# Patient Record
Sex: Female | Born: 2000 | Race: White | Hispanic: No | Marital: Single | Smoking: Never smoker
Health system: Southern US, Community
[De-identification: ages and names within clinical notes are randomized; demographics above are authoritative.]

## PROBLEM LIST (undated history)

## (undated) DIAGNOSIS — T7840XA Allergy, unspecified, initial encounter: Secondary | ICD-10-CM

## (undated) DIAGNOSIS — F840 Autistic disorder: Secondary | ICD-10-CM

## (undated) HISTORY — DX: Allergy, unspecified, initial encounter: T78.40XA

---
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
## 2018-03-24 ENCOUNTER — Ambulatory Visit: Payer: Medicaid Other

## 2018-03-24 ENCOUNTER — Ambulatory Visit (INDEPENDENT_AMBULATORY_CARE_PROVIDER_SITE_OTHER): Payer: Medicaid Other | Admitting: *Deleted

## 2018-03-24 DIAGNOSIS — N920 Excessive and frequent menstruation with regular cycle: Secondary | ICD-10-CM | POA: Diagnosis not present

## 2018-03-24 DIAGNOSIS — Z3042 Encounter for surveillance of injectable contraceptive: Secondary | ICD-10-CM

## 2018-03-31 ENCOUNTER — Ambulatory Visit: Payer: Medicaid Other

## 2018-06-10 ENCOUNTER — Other Ambulatory Visit: Payer: Self-pay | Admitting: Family Medicine

## 2018-06-10 DIAGNOSIS — N920 Excessive and frequent menstruation with regular cycle: Secondary | ICD-10-CM

## 2018-06-11 ENCOUNTER — Ambulatory Visit (INDEPENDENT_AMBULATORY_CARE_PROVIDER_SITE_OTHER): Payer: Medicaid Other | Admitting: *Deleted

## 2018-06-11 DIAGNOSIS — Z3042 Encounter for surveillance of injectable contraceptive: Secondary | ICD-10-CM | POA: Diagnosis not present

## 2018-06-11 MED ORDER — MEDROXYPROGESTERONE ACETATE 150 MG/ML IM SUSP
150.0000 mg | Freq: Once | INTRAMUSCULAR | Status: AC
  Administered 2018-06-11: 150 mg via INTRAMUSCULAR

## 2018-08-22 ENCOUNTER — Telehealth: Payer: Self-pay | Admitting: Family

## 2018-08-22 NOTE — Telephone Encounter (Signed)
Patient aware she may receive next dep inj  2/26-3/12

## 2018-09-05 ENCOUNTER — Ambulatory Visit (INDEPENDENT_AMBULATORY_CARE_PROVIDER_SITE_OTHER): Payer: Medicaid Other | Admitting: *Deleted

## 2018-09-05 DIAGNOSIS — Z3042 Encounter for surveillance of injectable contraceptive: Secondary | ICD-10-CM

## 2018-09-05 MED ORDER — MEDROXYPROGESTERONE ACETATE 150 MG/ML IM SUSY
150.0000 mg | PREFILLED_SYRINGE | INTRAMUSCULAR | Status: AC
Start: 2018-09-05 — End: 2018-09-05
  Administered 2018-09-05: 150 mg via INTRAMUSCULAR

## 2018-09-05 NOTE — Progress Notes (Signed)
Injection given and tolerated well.

## 2018-12-17 ENCOUNTER — Telehealth: Payer: Self-pay | Admitting: Family Medicine

## 2018-12-17 DIAGNOSIS — N920 Excessive and frequent menstruation with regular cycle: Secondary | ICD-10-CM

## 2018-12-17 MED ORDER — MEDROXYPROGESTERONE ACETATE 150 MG/ML IM SUSY: 1.0000 mL | PREFILLED_SYRINGE | INTRAMUSCULAR | 2 refills | Status: DC

## 2018-12-17 NOTE — Telephone Encounter (Signed)
Angela Mcdonald  Please send in Depo

## 2018-12-19 ENCOUNTER — Other Ambulatory Visit: Payer: Self-pay

## 2018-12-19 ENCOUNTER — Ambulatory Visit (INDEPENDENT_AMBULATORY_CARE_PROVIDER_SITE_OTHER): Payer: Medicaid Other

## 2018-12-19 DIAGNOSIS — Z3042 Encounter for surveillance of injectable contraceptive: Secondary | ICD-10-CM | POA: Diagnosis not present

## 2018-12-19 MED ORDER — MEDROXYPROGESTERONE ACETATE 150 MG/ML IM SUSP
150.0000 mg | Freq: Once | INTRAMUSCULAR | Status: AC
  Administered 2018-12-19: 150 mg via INTRAMUSCULAR

## 2019-01-21 IMAGING — DX DG CHEST 2V
2 series · 2 of 2 positions shown · non-contrast
Comparison: None.

CLINICAL DATA: Cough, fever, shortness of breath.

EXAM:
CHEST  2 VIEW

[chest pa]
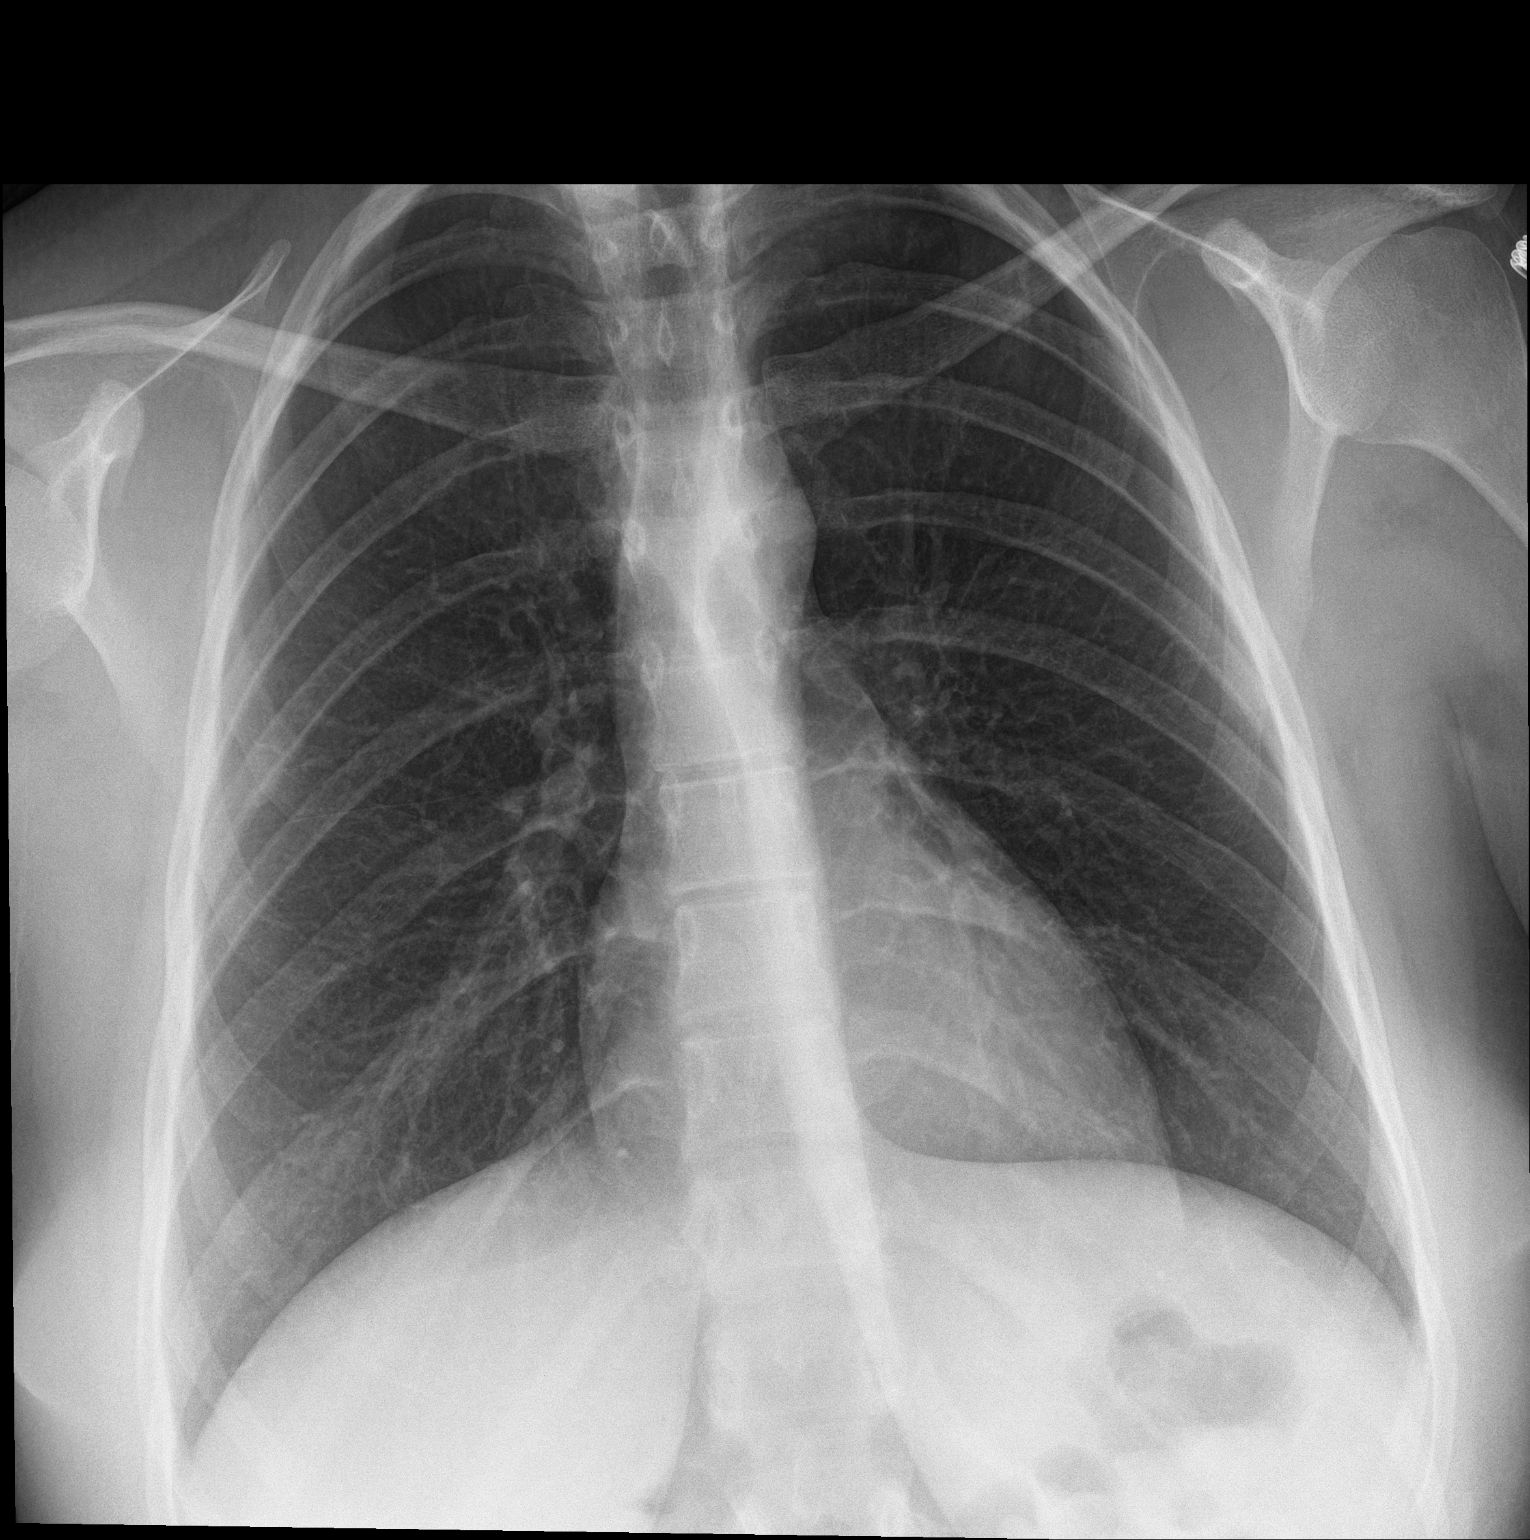

[chest lat]
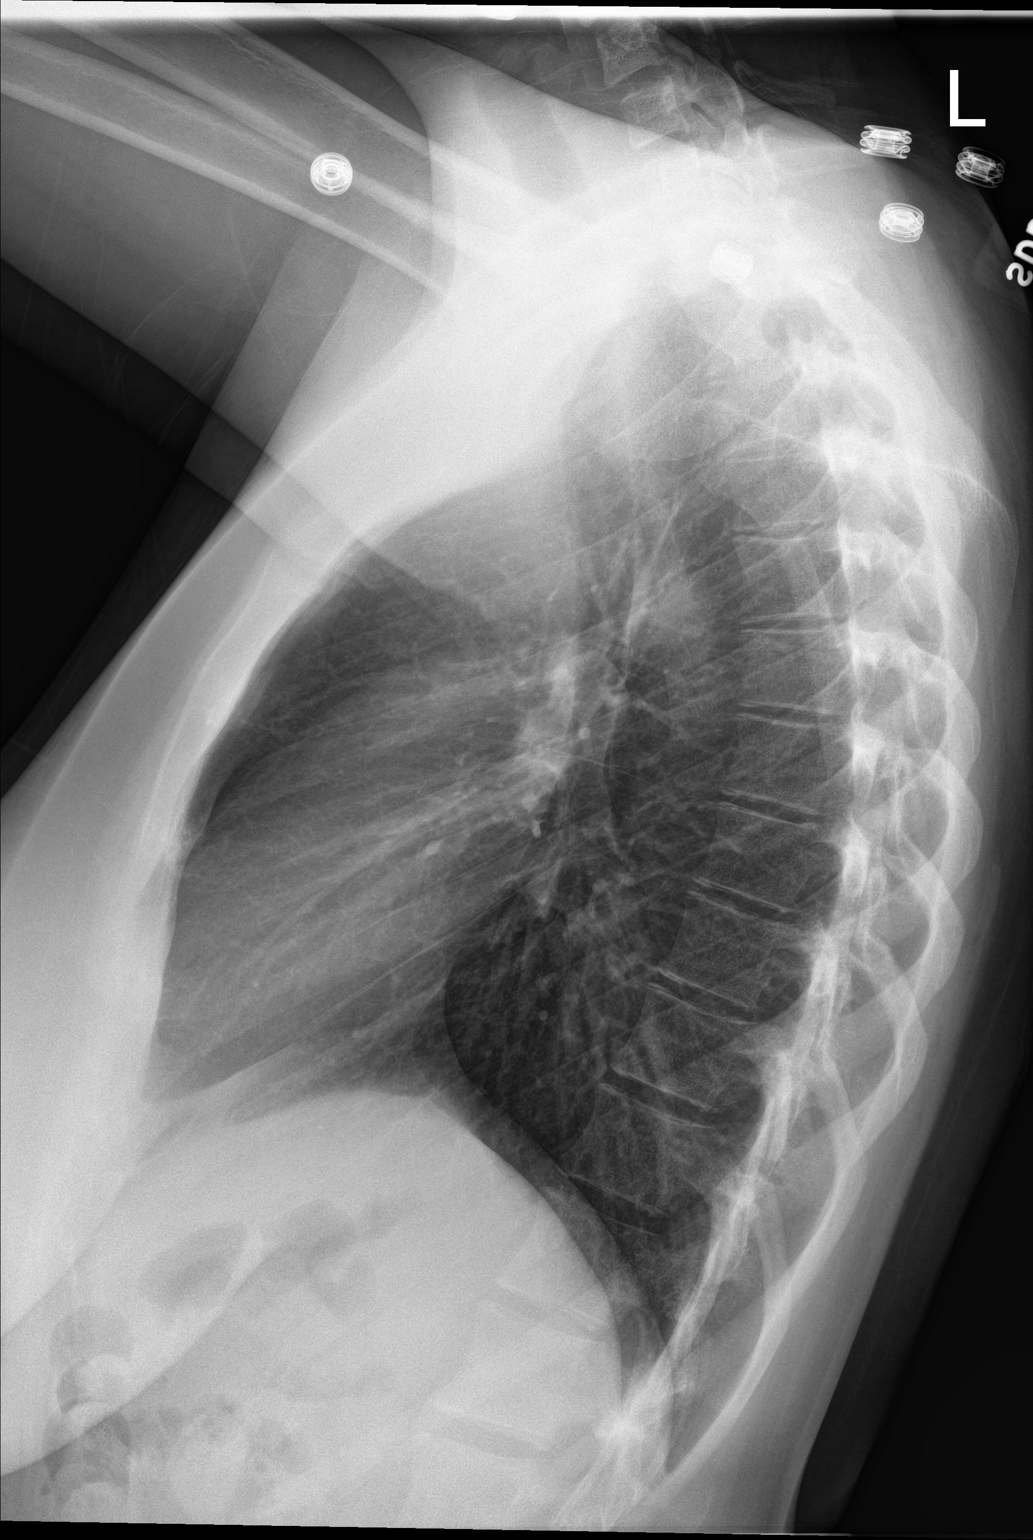

[2 of 2 positions shown; findings below may reference images not displayed]

FINDINGS: Normal cardiac and mediastinal contours. No consolidative pulmonary
opacities. No pleural effusion or pneumothorax.
IMPRESSION: No active cardiopulmonary disease.

## 2019-03-18 ENCOUNTER — Ambulatory Visit (INDEPENDENT_AMBULATORY_CARE_PROVIDER_SITE_OTHER): Payer: Medicaid Other | Admitting: Family Medicine

## 2019-03-18 ENCOUNTER — Encounter: Payer: Self-pay | Admitting: Family Medicine

## 2019-03-18 DIAGNOSIS — J309 Allergic rhinitis, unspecified: Secondary | ICD-10-CM | POA: Diagnosis not present

## 2019-03-18 DIAGNOSIS — F430 Acute stress reaction: Secondary | ICD-10-CM | POA: Diagnosis not present

## 2019-03-18 MED ORDER — LORATADINE 10 MG PO TABS: 10.0000 mg | ORAL_TABLET | Freq: Every day | ORAL | 11 refills | Status: DC

## 2019-03-18 NOTE — Progress Notes (Signed)
Virtual Visit via telephone Note  I connected with Angela Mcdonald on 03/18/19 at 0901 by telephone and verified that I am speaking with the correct person using two identifiers. Angela Mcdonald is currently located at home and Sister are currently with her during visit. The provider, Elige RadonJoshua A Dettinger, MD is located in their office at time of visit.  Call ended at 0914  I discussed the limitations, risks, security and privacy concerns of performing an evaluation and management service by telephone and the availability of in person appointments. I also discussed with the patient that there may be a patient responsible charge related to this service. The patient expressed understanding and agreed to proceed.   History and Present Illness: Patient sister is providing history because patient is severely autistic and essentially nonverbal.  The patient is complaining of headache by grabbing her head and has some sneezing and slight dry cough. They have noticed this over 1 month with the headaches. She has taking benadryl and it has not been changing. It has been off and on inconsistently. Deny any fevers or chills.   There is some concern about how she is handling the loss of her father.  She is stemming more and more distracted.  No diagnosis found.  Outpatient Encounter Medications as of 03/18/2019  Medication Sig  . cephALEXin (KEFLEX) 500 MG capsule Take 1 capsule (500 mg total) by mouth 2 (two) times daily.  . diphenhydrAMINE (BENADRYL) 25 MG tablet Take 25 mg by mouth every 6 (six) hours as needed for allergies.  . Lactulose 20 GM/30ML SOLN Take 30 mLs (20 g total) by mouth 2 (two) times daily.  . medroxyPROGESTERone Acetate 150 MG/ML SUSY Inject 1 mL (150 mg total) into the muscle every 3 (three) months.  . ondansetron (ZOFRAN ODT) 4 MG disintegrating tablet Take 1 tablet (4 mg total) by mouth 3 (three) times daily as needed for nausea or vomiting.  . polyethylene glycol powder  (GLYCOLAX/MIRALAX) powder Take 17 g by mouth 2 (two) times daily as needed.   No facility-administered encounter medications on file as of 03/18/2019.     Review of Systems  Constitutional: Negative for chills and fever.  HENT: Positive for sneezing. Negative for congestion, ear discharge, ear pain, sinus pressure and sore throat.   Eyes: Negative for redness and visual disturbance.  Respiratory: Negative for cough, chest tightness, shortness of breath and wheezing.   Cardiovascular: Negative for chest pain and leg swelling.  Musculoskeletal: Negative for back pain and gait problem.  Skin: Negative for rash.  Neurological: Positive for headaches. Negative for dizziness and light-headedness.  Psychiatric/Behavioral: Positive for behavioral problems, decreased concentration and dysphoric mood. Negative for agitation, self-injury, sleep disturbance and suicidal ideas. The patient is not nervous/anxious.   All other systems reviewed and are negative.   Observations/Objective: Patient sounds comfortable and in no acute   Assessment and Plan: Problem List Items Addressed This Visit    None    Visit Diagnoses    Allergic sinusitis    -  Primary   Relevant Medications   loratadine (CLARITIN) 10 MG tablet   Acute stress reaction       Relevant Orders   Ambulatory referral to Psychology       Follow Up Instructions: Follow up in needed    I discussed the assessment and treatment plan with the patient. The patient was provided an opportunity to ask questions and all were answered. The patient agreed with the plan and demonstrated an understanding  of the instructions.   The patient was advised to call back or seek an in-person evaluation if the symptoms worsen or if the condition fails to improve as anticipated.  The above assessment and management plan was discussed with the patient. The patient verbalized understanding of and has agreed to the management plan. Patient is aware to call  the clinic if symptoms persist or worsen. Patient is aware when to return to the clinic for a follow-up visit. Patient educated on when it is appropriate to go to the emergency department.    I provided 13 minutes of non-face-to-face time during this encounter.    Worthy Rancher, MD

## 2019-03-19 NOTE — Progress Notes (Signed)
She is an adult so she will not be able to go to the Psychological and [place] it be ok to send her to behavioral health? I know that she has a disability but if it is an outside her gaurdian will have to call due to payment.

## 2020-08-19 ENCOUNTER — Encounter: Payer: Self-pay | Admitting: Family Medicine

## 2020-08-19 ENCOUNTER — Other Ambulatory Visit: Payer: Self-pay

## 2020-08-19 ENCOUNTER — Ambulatory Visit (INDEPENDENT_AMBULATORY_CARE_PROVIDER_SITE_OTHER): Payer: Medicaid Other | Admitting: Family Medicine

## 2020-08-19 VITALS — BP 119/79 | HR 101 | Ht 60.0 in | Wt 150.0 lb

## 2020-08-19 DIAGNOSIS — J302 Other seasonal allergic rhinitis: Secondary | ICD-10-CM | POA: Diagnosis not present

## 2020-08-19 DIAGNOSIS — Z0000 Encounter for general adult medical examination without abnormal findings: Secondary | ICD-10-CM

## 2020-08-19 DIAGNOSIS — Z0001 Encounter for general adult medical examination with abnormal findings: Secondary | ICD-10-CM | POA: Diagnosis not present

## 2020-08-19 DIAGNOSIS — F840 Autistic disorder: Secondary | ICD-10-CM | POA: Diagnosis not present

## 2020-08-19 MED ORDER — FEXOFENADINE HCL 60 MG PO TABS: 60.0000 mg | ORAL_TABLET | Freq: Two times a day (BID) | ORAL | 3 refills | Status: AC | PRN

## 2020-08-19 MED ORDER — FLUTICASONE PROPIONATE 50 MCG/ACT NA SUSP: 1.0000 | Freq: Two times a day (BID) | NASAL | 6 refills | Status: DC | PRN

## 2020-08-19 NOTE — Progress Notes (Signed)
BP 119/79   Pulse (!) 101   Ht 5' (1.524 m)   Wt 150 lb (68 kg)   LMP 08/08/2020 (Approximate)   SpO2 96%   BMI 29.29 kg/m    Subjective:   Patient ID: Angela Mcdonald, female    DOB: [birth year], 20 y.o.   MRN: [id number]  HPI: Angela Mcdonald is a 20 y.o. female presenting on 08/19/2020 for Medical Management of Chronic Issues and Allergies (Would like to discuss new medication regimen. One that has been prescribed in the past caused nausea)   HPI Adult well exam and physical Patient is coming in today for adult well exam and physical Real complaint that she has is that she does have seasonal allergies.  She does struggle with her menstrual cycles, they are normal but just because of her autism has been a challenge for her.  The sensory is especially an issue.  She was doing Depo shots before and they did help with the cycles but it became too difficult for the family to make it up consistently.  We did discuss doing a Nexplanon and they are agreeable for this.  Because of her autism and sensory they would like to hold off on doing a full Pap smear as she has never been sexually active but will discuss in the future at some point.  Relevant past medical, surgical, family and social history reviewed and updated as indicated. Interim medical history since our last visit reviewed. Allergies and medications reviewed and updated.  Review of Systems  Constitutional: Negative for chills and fever.  HENT: Negative for ear pain and tinnitus.   Eyes: Negative for pain.  Respiratory: Negative for cough, shortness of breath and wheezing.   Cardiovascular: Negative for chest pain, palpitations and leg swelling.  Gastrointestinal: Negative for abdominal pain, blood in stool, constipation and diarrhea.  Genitourinary: Negative for dysuria and hematuria.  Musculoskeletal: Negative for back pain and myalgias.  Skin: Negative for rash.  Neurological: Negative for dizziness, weakness and  headaches.  Psychiatric/Behavioral: Negative for suicidal ideas.    Per HPI unless specifically indicated above   Allergies as of 08/19/2020      Reactions   Cat Hair Extract Anaphylaxis      Medication List       Accurate as of August 19, 2020  2:55 PM. If you have any questions, ask your nurse or doctor.        STOP taking these medications   medroxyPROGESTERone Acetate 150 MG/ML Susy Stopped by: Fransisca Kaufmann Dettinger, MD     TAKE these medications   diphenhydrAMINE 25 MG tablet Commonly known as: BENADRYL Take 25 mg by mouth every 6 (six) hours as needed for allergies.   HM MULTIVITAMIN ADULT GUMMY PO Take by mouth daily.   Lactulose 20 GM/30ML Soln Take 30 mLs (20 g total) by mouth 2 (two) times daily.   loratadine 10 MG tablet Commonly known as: CLARITIN Take 1 tablet (10 mg total) by mouth daily.   ondansetron 4 MG disintegrating tablet Commonly known as: Zofran ODT Take 1 tablet (4 mg total) by mouth 3 (three) times daily as needed for nausea or vomiting.   polyethylene glycol powder 17 GM/SCOOP powder Commonly known as: GLYCOLAX/MIRALAX Take 17 g by mouth 2 (two) times daily as needed.        Objective:   BP 119/79   Pulse (!) 101   Ht 5' (1.524 m)   Wt 150 lb (68 kg)   LMP 08/08/2020 (Approximate)  SpO2 96%   BMI 29.29 kg/m   Wt Readings from Last 3 Encounters:  08/19/20 150 lb (68 kg)  12/27/17 140 lb 12.8 oz (63.9 kg) (73 %, Z= 0.61)*  09/11/17 143 lb 3.2 oz (65 kg) (77 %, Z= 0.72)*   * Growth percentiles are based on CDC (Girls, 2-20 Years) data.    Physical Exam Vitals and nursing note reviewed.  Constitutional:      General: She is not in acute distress.    Appearance: She is well-developed and well-nourished. She is not diaphoretic.  Eyes:     Extraocular Movements: EOM normal.     Conjunctiva/sclera: Conjunctivae normal.  Cardiovascular:     Rate and Rhythm: Normal rate and regular rhythm.     Pulses: Intact distal pulses.      Heart sounds: Normal heart sounds. No murmur heard.   Pulmonary:     Effort: Pulmonary effort is normal. No respiratory distress.     Breath sounds: Normal breath sounds. No wheezing.  Musculoskeletal:        General: No tenderness or edema. Normal range of motion.  Skin:    General: Skin is warm and dry.     Findings: No rash.  Neurological:     Mental Status: She is alert and oriented to person, place, and time.     Coordination: Coordination normal.  Psychiatric:        Mood and Affect: Mood and affect normal.        Behavior: Behavior normal.       Assessment & Plan:   Problem List Items Addressed This Visit      Other   Autism    Other Visit Diagnoses    Well adult exam    -  Primary   Relevant Orders   CBC with Differential/Platelet   CMP14+EGFR   Lipid panel   TSH   Seasonal allergies       Relevant Medications   fexofenadine (ALLEGRA ALLERGY) 60 MG tablet   fluticasone (FLONASE) 50 MCG/ACT nasal spray      Change her allergy medicine to Allegra and Flonase that she can use as needed. Follow up plan: Return in about 1 year (around 08/19/2021), or if symptoms worsen or fail to improve.  Counseling provided for all of the vaccine components No orders of the defined types were placed in this encounter.   Caryl Pina, MD [facility] 08/19/2020, 2:55 PM

## 2020-08-20 LAB — CMP14+EGFR
ALT: 16 IU/L (ref 0–32)
AST: 21 IU/L (ref 0–40)
Albumin/Globulin Ratio: 1.5 (ref 1.2–2.2)
Albumin: 4.7 g/dL (ref 3.9–5.0)
Alkaline Phosphatase: 97 IU/L (ref 44–121)
BUN/Creatinine Ratio: 18 (ref 9–23)
BUN: 12 mg/dL (ref 6–20)
Bilirubin Total: 0.4 mg/dL (ref 0.0–1.2)
CO2: 19 mmol/L — ABNORMAL LOW (ref 20–29)
Calcium: 9.9 mg/dL (ref 8.7–10.2)
Chloride: 100 mmol/L (ref 96–106)
Creatinine, Ser: 0.68 mg/dL (ref 0.57–1.00)
GFR calc Af Amer: 145 mL/min/{1.73_m2} (ref >59)
GFR calc non Af Amer: 125 mL/min/{1.73_m2} (ref >59)
Globulin, Total: 3.1 g/dL (ref 1.5–4.5)
Glucose: 93 mg/dL (ref 65–99)
Potassium: 4.6 mmol/L (ref 3.5–5.2)
Sodium: 136 mmol/L (ref 134–144)
Total Protein: 7.8 g/dL (ref 6.0–8.5)

## 2020-08-20 LAB — CBC WITH DIFFERENTIAL/PLATELET
Basophils Absolute: 0.1 10*3/uL (ref 0.0–0.2)
Basos: 1 %
EOS (ABSOLUTE): 0 10*3/uL (ref 0.0–0.4)
Eos: 0 %
Hematocrit: 38.6 % (ref 34.0–46.6)
Hemoglobin: 12.7 g/dL (ref 11.1–15.9)
Immature Grans (Abs): 0 10*3/uL (ref 0.0–0.1)
Immature Granulocytes: 0 %
Lymphocytes Absolute: 1.8 10*3/uL (ref 0.7–3.1)
Lymphs: 18 %
MCH: 30 pg (ref 26.6–33.0)
MCHC: 32.9 g/dL (ref 31.5–35.7)
MCV: 91 fL (ref 79–97)
Monocytes Absolute: 0.5 10*3/uL (ref 0.1–0.9)
Monocytes: 5 %
Neutrophils Absolute: 7.5 10*3/uL — ABNORMAL HIGH (ref 1.4–7.0)
Neutrophils: 76 %
Platelets: 325 10*3/uL (ref 150–450)
RBC: 4.23 x10E6/uL (ref 3.77–5.28)
RDW: 13 % (ref 11.7–15.4)
WBC: 9.9 10*3/uL (ref 3.4–10.8)

## 2020-08-20 LAB — TSH: TSH: 1.31 u[IU]/mL (ref 0.450–4.500)

## 2020-08-20 LAB — LIPID PANEL
Chol/HDL Ratio: 4.2 ratio (ref 0.0–4.4)
Cholesterol, Total: 202 mg/dL — ABNORMAL HIGH (ref 100–199)
HDL: 48 mg/dL (ref >39)
LDL Chol Calc (NIH): 145 mg/dL — ABNORMAL HIGH (ref 0–99)
Triglycerides: 50 mg/dL (ref 0–149)
VLDL Cholesterol Cal: 9 mg/dL (ref 5–40)

## 2020-08-25 ENCOUNTER — Ambulatory Visit: Payer: Medicaid Other | Admitting: Family Medicine

## 2020-09-21 ENCOUNTER — Ambulatory Visit: Payer: Medicaid Other | Admitting: Family Medicine

## 2021-01-23 ENCOUNTER — Telehealth: Payer: Self-pay | Admitting: Family Medicine

## 2021-01-23 ENCOUNTER — Telehealth: Payer: Medicaid Other | Admitting: Nurse Practitioner

## 2021-01-23 ENCOUNTER — Encounter: Payer: Self-pay | Admitting: Nurse Practitioner

## 2021-01-23 DIAGNOSIS — L989 Disorder of the skin and subcutaneous tissue, unspecified: Secondary | ICD-10-CM

## 2021-01-23 MED ORDER — AMOXICILLIN-POT CLAVULANATE 875-125 MG PO TABS: 1.0000 | ORAL_TABLET | Freq: Two times a day (BID) | ORAL | 0 refills | Status: DC

## 2021-01-23 NOTE — Telephone Encounter (Signed)
Sister is wanting some form of imaging. It does not have to be a CT scan. She does not believe that the nodules are cysts. Pt has been c/o headaches.

## 2021-01-23 NOTE — Progress Notes (Signed)
   Virtual Visit via video Note   Due to COVID-19 pandemic this visit was conducted virtually. This visit type was conducted due to national recommendations for restrictions regarding the COVID-19 Pandemic (e.g. social distancing, sheltering in place) in an effort to limit this patient's exposure and mitigate transmission in our community. All issues noted in this document were discussed and addressed.  A physical exam was not performed with this format.  I connected with  Angela Mcdonald  on 01/23/21 at 9:25 by video and verified that I am speaking with the correct person using two identifiers. Angela Mcdonald is currently located at home and  her cre giver is currently with her during visit. The provider, Mary-Margaret Daphine Deutscher, FNP is located in their office at time of visit.  I discussed the limitations, risks, security and privacy concerns of performing an evaluation and management service by video  and the availability of in person appointments. I also discussed with the patient that there may be a patient responsible charge related to this service. The patient expressed understanding and agreed to proceed.   History and Present Illness:  Patient is autistic and is not able to do much communication.  Caregiver states that patient is c/o headache. She has a knot on her head. Head hurts when touch knot. Both patient and caregiver deny injury   Review of Systems  Constitutional:  Negative for chills and fever.  HENT: Negative.    Eyes:  Negative for blurred vision and double vision.  Respiratory: Negative.    Cardiovascular: Negative.   Gastrointestinal:  Negative for nausea and vomiting.  Genitourinary: Negative.   Neurological:  Negative for dizziness and headaches.  All other systems reviewed and are negative.     Observations/Objective: Alert and oriented- answers all questions appropriately No distress Caregiver describes knot as hard and about the size of a nickel. Top of  scalp just left of midline. Tender to touch. No drainage.  Assessment and Plan: Angela Mcdonald in today with chief complaint of knot on head  1. Scalp lesion Continue to Monitor If not resolved by Monday- keep appointment with DR. Dettinger  Meds ordered this encounter  Medications   amoxicillin-clavulanate (AUGMENTIN) 875-125 MG tablet    Sig: Take 1 tablet by mouth 2 (two) times daily.    Dispense:  14 tablet    Refill:  0    Order Specific Question:   Supervising Provider    Answer:   Arville Care A [[id number]]      Follow Up Instructions: prn    I discussed the assessment and treatment plan with the patient. The patient was provided an opportunity to ask questions and all were answered. The patient agreed with the plan and demonstrated an understanding of the instructions.   The patient was advised to call back or seek an in-person evaluation if the symptoms worsen or if the condition fails to improve as anticipated.  The above assessment and management plan was discussed with the patient. The patient verbalized understanding of and has agreed to the management plan. Patient is aware to call the clinic if symptoms persist or worsen. Patient is aware when to return to the clinic for a follow-up visit. Patient educated on when it is appropriate to go to the emergency department.   Time call ended: 9:40  I provided 20 minutes of face-to-face time during this encounter.    Mary-Margaret Daphine Deutscher, FNP

## 2021-01-23 NOTE — Telephone Encounter (Signed)
There is no other scan to do on head and swhe will have to be seen in person to see nodule.

## 2021-01-23 NOTE — Telephone Encounter (Signed)
Insurance will not approve CT scan with no neurological deficits. The only way can get that done is to g o to the ED because they dont hav eto have insurance approval for scans.

## 2021-01-24 NOTE — Telephone Encounter (Signed)
Patients sister notified and verbalized understanding. Patient has an appointment with Dr Dettinger this coming Monday to be evaluated

## 2021-01-30 ENCOUNTER — Encounter: Payer: Self-pay | Admitting: Family Medicine

## 2021-01-30 ENCOUNTER — Ambulatory Visit: Payer: Medicaid Other | Admitting: Family Medicine

## 2021-01-30 ENCOUNTER — Other Ambulatory Visit: Payer: Self-pay

## 2021-01-30 VITALS — BP 103/63 | HR 103 | Ht 60.0 in | Wt 149.0 lb

## 2021-01-30 DIAGNOSIS — L739 Follicular disorder, unspecified: Secondary | ICD-10-CM | POA: Diagnosis not present

## 2021-01-30 DIAGNOSIS — R519 Headache, unspecified: Secondary | ICD-10-CM

## 2021-01-30 NOTE — Progress Notes (Signed)
BP 103/63   Pulse (!) 103   Ht 5' (1.524 m)   Wt 149 lb (67.6 kg)   SpO2 99%   BMI 29.10 kg/m    Subjective:   Patient ID: Angela Mcdonald, female    DOB: 17-Jan-2001, 20 y.o.   MRN: [id number]  HPI: Angela Mcdonald is a 20 y.o. female presenting on 01/30/2021 for nodule (Scalp. Present for weeks. Not inflamed. Concerned because pt has a h/o HA)   HPI Headaches and scalp lesion Patient has been having headaches for about 2 weeks and then noticed some lesions on her scalp on top of her head over the past week, they do not know if these started the headaches but were concerned about the headaches.  They deny any new numbness or weakness or personality changes or anything significant in the most recent couple weeks.  They deny any speech abnormalities or personality changes.  The spot on top of her head is usually where she complains that it hurts and will sometimes wake her up at night and sometimes happens during the day as well.  Relevant past medical, surgical, family and social history reviewed and updated as indicated. Interim medical history since our last visit reviewed. Allergies and medications reviewed and updated.  Review of Systems  Constitutional:  Negative for chills and fever.  Eyes:  Negative for visual disturbance.  Respiratory:  Negative for chest tightness and shortness of breath.   Cardiovascular:  Negative for chest pain and leg swelling.  Skin:  Positive for rash. Negative for color change.  Neurological:  Positive for headaches. Negative for dizziness, facial asymmetry and light-headedness.  Psychiatric/Behavioral:  Negative for agitation and behavioral problems.   All other systems reviewed and are negative.  Per HPI unless specifically indicated above   Allergies as of 01/30/2021       Reactions   Cat Hair Extract Anaphylaxis        Medication List        Accurate as of January 30, 2021  4:01 PM. If you have any questions, ask your nurse or doctor.           STOP taking these medications    fluticasone 50 MCG/ACT nasal spray Commonly known as: FLONASE Stopped by: Nils Pyle, MD   Lactulose 20 GM/30ML Soln Stopped by: Elige Radon Salvatrice Morandi, MD   ondansetron 4 MG disintegrating tablet Commonly known as: Zofran ODT Stopped by: Elige Radon Dorri Ozturk, MD       TAKE these medications    amoxicillin-clavulanate 875-125 MG tablet Commonly known as: AUGMENTIN Take 1 tablet by mouth 2 (two) times daily.   diphenhydrAMINE 25 MG tablet Commonly known as: BENADRYL Take 25 mg by mouth every 6 (six) hours as needed for allergies.   fexofenadine 60 MG tablet Commonly known as: Allegra Allergy Take 1 tablet (60 mg total) by mouth 2 (two) times daily as needed for allergies or rhinitis.   HM MULTIVITAMIN ADULT GUMMY PO Take by mouth daily.   polyethylene glycol powder 17 GM/SCOOP powder Commonly known as: GLYCOLAX/MIRALAX Take 17 g by mouth 2 (two) times daily as needed.         Objective:   BP 103/63   Pulse (!) 103   Ht 5' (1.524 m)   Wt 149 lb (67.6 kg)   SpO2 99%   BMI 29.10 kg/m   Wt Readings from Last 3 Encounters:  01/30/21 149 lb (67.6 kg)  08/19/20 150 lb (68 kg)  12/27/17 140 lb  12.8 oz (63.9 kg) (73 %, Z= 0.61)*   * Growth percentiles are based on CDC (Girls, 2-20 Years) data.    Physical Exam Vitals and nursing note reviewed.  Constitutional:      General: She is not in acute distress.    Appearance: She is well-developed. She is not diaphoretic.  Eyes:     Conjunctiva/sclera: Conjunctivae normal.  Skin:    General: Skin is warm and dry.     Findings: Rash (Raised slightly tender nodules in the center of the top of her scalp, slight erythema, is already been on antibiotic for few days) present.  Neurological:     Mental Status: She is alert and oriented to person, place, and time.     Coordination: Coordination normal.  Psychiatric:        Behavior: Behavior normal.      Assessment &  Plan:   Problem List Items Addressed This Visit   None Visit Diagnoses     Acute folliculitis    -  Primary   New onset of headaches       Relevant Orders   CT HEAD WO CONTRAST ( )       Patient is already on antibiotic, instructed to continue the antibiotic and do ibuprofen and cold compresses  Likely due to tension from infection but will order CT scan head. Follow up plan: Return if symptoms worsen or fail to improve.  Counseling provided for all of the vaccine components Orders Placed This Encounter  Procedures   CT HEAD WO CONTRAST ( )    Arville Care, MD [facility] 01/30/2021, 4:01 PM

## 2021-02-01 ENCOUNTER — Telehealth: Payer: Self-pay | Admitting: Family Medicine

## 2021-02-02 ENCOUNTER — Ambulatory Visit (HOSPITAL_BASED_OUTPATIENT_CLINIC_OR_DEPARTMENT_OTHER)
Admission: RE | Admit: 2021-02-02 | Discharge: 2021-02-02 | Disposition: A | Discharge status: Home / Self Care | Payer: Medicaid Other | Source: Ambulatory Visit | Attending: Family Medicine | Admitting: Family Medicine

## 2021-02-02 ENCOUNTER — Other Ambulatory Visit: Payer: Self-pay

## 2021-02-02 DIAGNOSIS — R519 Headache, unspecified: Secondary | ICD-10-CM | POA: Diagnosis not present

## 2021-02-03 ENCOUNTER — Telehealth: Payer: Self-pay | Admitting: Family Medicine

## 2021-02-03 DIAGNOSIS — L989 Disorder of the skin and subcutaneous tissue, unspecified: Secondary | ICD-10-CM

## 2021-02-03 NOTE — Telephone Encounter (Signed)
Pt sister made aware of results. She would like to know what the next step is. Derm? See a someone for headaches?

## 2021-02-03 NOTE — Telephone Encounter (Signed)
Please review and advise.

## 2021-02-06 NOTE — Telephone Encounter (Signed)
Recommend dermatology first, if they have somebody down in their area then let us know, let me know if they need a referral

## 2021-03-01 ENCOUNTER — Ambulatory Visit: Payer: Self-pay | Admitting: Family Medicine

## 2022-03-13 ENCOUNTER — Encounter: Payer: Self-pay | Admitting: *Deleted

## 2022-04-02 NOTE — Progress Notes (Unsigned)
Referring Provider: Dede Query, PA-C Primary Care Physician:  Dede Query, PA-C Primary Gastroenterologist:  Dr. Gala Romney  No chief complaint on file.   HPI:   Angela Mcdonald is a 21 y.o. female presenting today at the request of Dede Query, PA-C for dysphagia.   Reviewed office visit with Rochele Raring, PA-C on 02/28/2022.  Patient presented with her sister who reported patient has been having behavioral and memory changes for the last couple of years, taking longer to finish tasks, hard time focusing, having internal conversations.  Having trouble sleeping.  Also noticed increased difficulty with swallowing, only with liquids.  No issues swallowing solids.  She was referred to neurology, GI, started on Intuniv, and labs updated.  She had no significant lab abnormalities on CBC, CMP, TSH.  Cholesterol was elevated.  She is scheduled to see neurology on 06/20/2022.  Past Medical History:  Diagnosis Date   Allergy    cats   Autism     No past surgical history on file.  Current Outpatient Medications  Medication Sig Dispense Refill   amoxicillin-clavulanate (AUGMENTIN) 875-125 MG tablet Take 1 tablet by mouth 2 (two) times daily. 14 tablet 0   diphenhydrAMINE (BENADRYL) 25 MG tablet Take 25 mg by mouth every 6 (six) hours as needed for allergies.     fexofenadine (ALLEGRA ALLERGY) 60 MG tablet Take 1 tablet (60 mg total) by mouth 2 (two) times daily as needed for allergies or rhinitis. 180 tablet 3   Multiple Vitamins-Minerals (HM MULTIVITAMIN ADULT GUMMY PO) Take by mouth daily.     polyethylene glycol powder (GLYCOLAX/MIRALAX) powder Take 17 g by mouth 2 (two) times daily as needed. 3350 g 1   No current facility-administered medications for this visit.    Allergies as of 04/04/2022 - Review Complete 01/30/2021  Allergen Reaction Noted   Cat hair extract Anaphylaxis 01/11/2017    Family History  Problem Relation Age of Onset   Depression Mother     Hearing loss Mother    Diabetes Father    Hyperlipidemia Father    Hypertension Father    Stroke Father    Learning disabilities Brother    Cancer Maternal Aunt        breast, 56   Arthritis Maternal Grandfather     Social History   Socioeconomic History   Marital status: Single    Spouse name: Not on file   Number of children: Not on file   Years of education: Not on file   Highest education level: Not on file  Occupational History   Not on file  Tobacco Use   Smoking status: Never   Smokeless tobacco: Never  Vaping Use   Vaping Use: Never used  Substance and Sexual Activity   Alcohol use: No   Drug use: No   Sexual activity: Never  Other Topics Concern   Not on file  Social History Narrative   Not on file   Social Determinants of Health   Financial Resource Strain: Not on file  Food Insecurity: Not on file  Transportation Needs: Not on file  Physical Activity: Not on file  Stress: Not on file  Social Connections: Not on file  Intimate Partner Violence: Not on file    Review of Systems: Gen: Denies any fever, chills, cold or flu like symptoms, pre-syncope, or syncope.  CV: Denies chest pain, heart palpitations. Resp: Denies shortness of breath, cough.  GI: See HPI GU : Denies urinary burning, urinary frequency, urinary  hesitancy MS: Denies joint pain. Derm: Denies rash. Psych: Denies depression, anxiety. Heme: See HPI  Physical Exam: There were no vitals taken for this visit. General:   Alert and oriented. Pleasant and cooperative. Well-nourished and well-developed.  Head:  Normocephalic and atraumatic. Eyes:  Without icterus, sclera clear and conjunctiva pink.  Ears:  Normal auditory acuity. Lungs:  Clear to auscultation bilaterally. No wheezes, rales, or rhonchi. No distress.  Heart:  S1, S2 present without murmurs appreciated.  Abdomen:  +BS, soft, non-tender and non-distended. No HSM noted. No guarding or rebound. No masses appreciated.   Rectal:  Deferred  Msk:  Symmetrical without gross deformities. Normal posture. Extremities:  Without edema. Neurologic:  Alert and  oriented x4;  grossly normal neurologically. Skin:  Intact without significant lesions or rashes. Psych:  Normal mood and affect.    Assessment:     Plan:  ***   Aliene Altes, PA-C [facility] 04/04/2022

## 2022-04-04 ENCOUNTER — Ambulatory Visit: Payer: Medicaid Other | Admitting: Gastroenterology

## 2022-04-04 ENCOUNTER — Encounter: Payer: Self-pay | Admitting: Gastroenterology

## 2022-04-04 VITALS — BP 96/60 | HR 60 | Temp 97.9°F | Ht 61.0 in | Wt 154.8 lb

## 2022-04-04 DIAGNOSIS — R1310 Dysphagia, unspecified: Secondary | ICD-10-CM | POA: Insufficient documentation

## 2022-04-04 DIAGNOSIS — K5900 Constipation, unspecified: Secondary | ICD-10-CM | POA: Insufficient documentation

## 2022-04-04 DIAGNOSIS — R070 Pain in throat: Secondary | ICD-10-CM | POA: Insufficient documentation

## 2022-04-04 NOTE — Patient Instructions (Signed)
We will arrange you to have a swallowing study at [facility].  We will also arrange you to have an evaluation by speech therapy at [facility] 20 mg nightly to see if this will help with morning throat burning.  Avoid eating within 3 hours of going to bed.  Try propping the head of the bed up 6 inches.  For mild constipation: Start Benefiber 2 teaspoons daily x2 weeks, then increase to twice daily. Continue to use MiraLAX 17 g as needed for breakthrough constipation. Let me know of any worsening symptoms.  We will plan to follow-up in 3 months or sooner if needed pending swallow study results.   Aliene Altes, PA-C [facility]

## 2022-04-05 ENCOUNTER — Other Ambulatory Visit: Payer: Self-pay | Admitting: *Deleted

## 2022-04-05 ENCOUNTER — Telehealth: Payer: Self-pay | Admitting: *Deleted

## 2022-04-05 DIAGNOSIS — R1310 Dysphagia, unspecified: Secondary | ICD-10-CM

## 2022-04-05 NOTE — Telephone Encounter (Signed)
Called pt and spoke with caregiver Freda Munro. Gave BPE appt details. She voiced understanding

## 2022-04-06 ENCOUNTER — Other Ambulatory Visit (HOSPITAL_COMMUNITY): Payer: Self-pay | Admitting: Specialist

## 2022-04-06 DIAGNOSIS — R1312 Dysphagia, oropharyngeal phase: Secondary | ICD-10-CM

## 2022-04-12 ENCOUNTER — Ambulatory Visit (HOSPITAL_COMMUNITY)
Admission: RE | Admit: 2022-04-12 | Discharge: 2022-04-12 | Disposition: A | Discharge status: Home / Self Care | Payer: Medicaid Other | Source: Ambulatory Visit | Attending: Gastroenterology | Admitting: Gastroenterology

## 2022-04-12 DIAGNOSIS — R1310 Dysphagia, unspecified: Secondary | ICD-10-CM | POA: Insufficient documentation

## 2022-04-16 ENCOUNTER — Ambulatory Visit (HOSPITAL_COMMUNITY): Payer: Medicaid Other | Attending: Physician Assistant | Admitting: Speech Pathology

## 2022-04-16 ENCOUNTER — Encounter (HOSPITAL_COMMUNITY): Payer: Self-pay | Admitting: Speech Pathology

## 2022-04-16 ENCOUNTER — Ambulatory Visit (HOSPITAL_COMMUNITY)
Admission: RE | Admit: 2022-04-16 | Discharge: 2022-04-16 | Disposition: A | Discharge status: Home / Self Care | Payer: Medicaid Other | Source: Ambulatory Visit | Attending: Gastroenterology | Admitting: Gastroenterology

## 2022-04-16 DIAGNOSIS — R1312 Dysphagia, oropharyngeal phase: Secondary | ICD-10-CM | POA: Diagnosis present

## 2022-04-16 NOTE — Therapy (Signed)
[facility] [address] [place], [place], [address] Phone: [phone]   Fax:  [phone]  Modified Barium Swallow  Patient Details  Name: Angela Mcdonald MRN: [id number] Date of Birth: 05-10-2001 No data recorded  Encounter Date: 04/16/2022   End of Session - 04/16/22 1334     Visit Number 1    Number of Visits 1    Authorization Type Medicaid    SLP Start Time 1148    SLP Stop Time  1220    SLP Time Calculation (min) 32 min    Activity Tolerance Patient tolerated treatment well             Past Medical History:  Diagnosis Date   Allergy    cats   Autism     History reviewed. No pertinent surgical history.  There were no vitals filed for this visit.        General - 04/16/22 1311       General Information   Date of Onset 04/04/22    HPI Angela Mcdonald is a 21 yo female with Autism who was referred for MBSS by Ermalinda Memos, PA-C due to Pt's report of discomfort with swallowing and caregiver/sister report of Pt taking a long time to drink liquids. Pt had BPE last week and was WNL. Pt was recently started on a reflux medication and reports decrease in "discomfort" with swallow. Pt has Autism and is verbal, but had a difficult time explaining her symptoms. Pt's sister is now her primary caregiver and just wants to make sure that she gets the help she needs, if needed. No recent URI or PNA.    Type of Study MBS-Modified Barium Swallow Study    Diet Prior to this Study Regular;Thin liquids    Temperature Spikes Noted No    Respiratory Status Room air    History of Recent Intubation No    Behavior/Cognition Alert;Cooperative;Pleasant mood    Oral Cavity Assessment Within Functional Limits    Oral Care Completed by SLP No    Oral Cavity - Dentition Adequate natural dentition    Vision Functional for self feeding    Self-Feeding Abilities Able to feed self    Patient Positioning Upright in chair    Baseline  Vocal Quality Normal    Volitional Cough Strong    Volitional Swallow Able to elicit    Pharyngeal Secretions Not observed secondary MBS                Oral Preparation/Oral Phase - 04/16/22 1327       Oral Preparation/Oral Phase   Oral Phase Impaired      Oral - Thin   Oral - Thin Teaspoon Decreased bolus cohesion;Oral residue;Piecemeal swallowing    Oral - Thin Cup Decreased bolus cohesion;Oral residue;Piecemeal swallowing    Oral - Thin Straw Oral residue;Piecemeal swallowing      Oral - Solids   Oral - Puree Within functional limits    Oral - Regular Within functional limits    Oral - Pill Left anterior bolus loss;Right anterior bolus loss      Electrical stimulation - Oral Phase   Was Electrical Stimulation Used No              Pharyngeal Phase - 04/16/22 1330       Pharyngeal Phase   Pharyngeal Phase Impaired      Pharyngeal - Thin   Pharyngeal- Thin Teaspoon Swallow initiation at pyriform sinus    Pharyngeal-  Thin Cup Swallow initiation at pyriform sinus;Other (Comment)   pooling in the pharynx after the swallow from oral residuals, spontaeous second swallow cleared   Pharyngeal- Thin Straw Swallow initiation at pyriform sinus      Pharyngeal - Solids   Pharyngeal- Puree Swallow initiation at vallecula;Within functional limits    Pharyngeal- Regular Delayed swallow initiation-vallecula;Within functional limits    Pharyngeal- Pill Within functional limits;Swallow initiation at pyriform sinus      Electrical Stimulation - Pharyngeal Phase   Was Electrical Stimulation Used No              Cricopharyngeal Phase - 04/16/22 1333       Cervical Esophageal Phase   Cervical Esophageal Phase Within functional limits              Plan - 04/16/22 1335     Clinical Impression Statement Pt presents with min oropharyngeal dysphagia characterized by impaired oral control with liquids with reduced bolus cohesiveness, premature spillage over the base  of tongue, and min delay in swallow initiation with swallow trigger at the level of the pyriforms with all liquids (tsp/cup/straw) with NO penetration or aspiration. Pt with pooling in the pharynx after the initiation swallow from oral residuals spilling down, however Pt elicits a secondary swallow which clears the pharynx. Oropharyngeal phase was WNL with purees and solids. The imaging was reviewed with Pt and her sister/caregiver and questions answered. Pt tipped her head back when taking the barium tablet, which resulted in labial spillage of thins (took a large amount). Recommend regular textures and thin liquids via cup/straw and PO medication whole with water. SLP suggested that they could try using smaller cups and encourage one sip at time and resting cup on the table between sips (place a bright sticky note on the table for Pt to place cup). Pt does not appear to be at risk for aspiration. Pt does endorse relief of discomfort, which may or may not coincide with being on a reflux medication. Caregiver was given my contact information should they have further questions. No further SLP services indicated at this time.    Potential to Achieve Goals Good    Consulted and Agree with Plan of Care Patient;Family member/caregiver             Patient will benefit from skilled therapeutic intervention in order to improve the following deficits and impairments:   Dysphagia, oropharyngeal phase     Recommendations/Treatment - 04/16/22 1333       Swallow Evaluation Recommendations   SLP Diet Recommendations Thin;Age appropriate regular    Liquid Administration via Cup;Straw    Medication Administration Whole meds with liquid    Supervision Patient able to self feed;Intermittent supervision to cue for compensatory strategies    Compensations Monitor for anterior loss;Multiple dry swallows after each bite/sip    Postural Changes Seated upright at 90 degrees;Remain upright for at least 30 minutes  after feeds/meals              Prognosis - 04/16/22 1333       Prognosis   Prognosis for Safe Diet Advancement Good      Individuals Consulted   Consulted and Agree with Results and Recommendations Patient;Family member/caregiver    Family Member Consulted Sister    Report Sent to  Referring physician             Problem List Patient Active Problem List   Diagnosis Date Noted   Dysphagia 04/04/2022   Burning sensation  of throat 04/04/2022   Constipation 04/04/2022   Menorrhagia with regular cycle 05/08/2017   Autism 01/11/2017   Thank you,  Genene Churn, [place]  Genene Churn, [place] 04/16/2022, 1:44 PM  [facility] [address] [place], [place], [address] Phone: [phone]   Fax:  [phone]  Name: Angela Mcdonald MRN: [id number] Date of Birth: 09-11-2000

## 2022-05-01 ENCOUNTER — Other Ambulatory Visit: Payer: Self-pay | Admitting: *Deleted

## 2022-05-01 DIAGNOSIS — R070 Pain in throat: Secondary | ICD-10-CM

## 2022-05-01 DIAGNOSIS — R1310 Dysphagia, unspecified: Secondary | ICD-10-CM

## 2022-05-17 ENCOUNTER — Ambulatory Visit (HOSPITAL_COMMUNITY): Payer: Medicaid Other | Admitting: Speech Pathology

## 2022-05-30 ENCOUNTER — Ambulatory Visit (HOSPITAL_COMMUNITY): Payer: Medicaid Other | Attending: Physician Assistant | Admitting: Speech Pathology

## 2022-05-30 ENCOUNTER — Encounter (HOSPITAL_COMMUNITY): Payer: Self-pay | Admitting: Speech Pathology

## 2022-05-30 DIAGNOSIS — R1312 Dysphagia, oropharyngeal phase: Secondary | ICD-10-CM | POA: Insufficient documentation

## 2022-05-30 NOTE — Therapy (Signed)
[facility] [address] [place], [place], [address] Phone: [phone]   Fax:  [phone]  Speech Language Pathology Evaluation  Patient Details  Name: Angela Mcdonald MRN: [id number] Date of Birth: [birth year] No data recorded  Encounter Date: 05/30/2022   End of Session - 05/30/22 1312     Visit Number 1    Number of Visits 3    Authorization Type Medicaid    SLP Start Time 0908    SLP Stop Time  0950    SLP Time Calculation (min) 42 min    Activity Tolerance Patient tolerated treatment well             Past Medical History:  Diagnosis Date   Allergy    cats   Autism     History reviewed. No pertinent surgical history.  There were no vitals filed for this visit.   Subjective Assessment - 05/30/22 1308     Subjective "I am doing great."    Patient is accompained by: Family member    Currently in Pain? No/denies            MBSS from 04/16/2022: <<Pt presents with min oropharyngeal dysphagia characterized by impaired oral control with liquids with reduced bolus cohesiveness, premature spillage over the base of tongue, and min delay in swallow initiation with swallow trigger at the level of the pyriforms with all liquids (tsp/cup/straw) with NO penetration or aspiration. Pt with pooling in the pharynx after the initiation swallow from oral residuals spilling down, however Pt elicits a secondary swallow which clears the pharynx. Oropharyngeal phase was WNL with purees and solids. The imaging was reviewed with Pt and her sister/caregiver and questions answered. Pt tipped her head back when taking the barium tablet, which resulted in labial spillage of thins (took a large amount). Recommend regular textures and thin liquids via cup/straw and PO medication whole with water. SLP suggested that they could try using smaller cups and encourage one sip at time and resting cup on the table between sips (place a bright sticky note on  the table for Pt to place cup). Pt does not appear to be at risk for aspiration. Pt does endorse relief of discomfort, which may or may not coincide with being on a reflux medication. Caregiver was given my contact information should they have further questions. No further SLP services indicated at this time.>>   Prior Functional Status - 05/30/22 1309       Prior Functional Status   Cognitive/Linguistic Baseline Baseline deficits    Baseline deficit details Pt has Autism    Type of Home House     Lives With Family    Available Help at Discharge Family             General - 05/30/22 1309       General Information   Date of Onset 04/04/22    HPI Angela Mcdonald is a 21 yo female with Autism who was referred for MBSS by Ermalinda Memos, PA-C due to Pt's report of discomfort with swallowing and caregiver/sister report of Pt taking a long time to drink liquids. Pt had BPE last week and was WNL. Pt was recently started on a reflux medication and reports decrease in "discomfort" with swallow. Pt has Autism and is verbal, but had a difficult time explaining her symptoms. Pt's sister is now her primary caregiver and just wants to make sure that she gets the help she needs, if needed. No recent URI  or PNA.    Type of Study Bedside Swallow Evaluation    Previous Swallow Assessment MBSS with recommendation for regular textures and thin liquids on 04/16/2022    Diet Prior to this Study Regular;Thin liquids    Temperature Spikes Noted No    Respiratory Status Room air    History of Recent Intubation No    Behavior/Cognition Alert;Cooperative;Pleasant mood    Oral Cavity Assessment Within Functional Limits    Oral Care Completed by SLP No    Oral Cavity - Dentition Adequate natural dentition    Vision Functional for self-feeding    Self-Feeding Abilities Able to feed self    Patient Positioning Upright in chair    Baseline Vocal Quality Normal    Volitional Cough Strong    Volitional Swallow  Able to elicit              Oral Motor/Sensory Function - 05/30/22 1311       Oral Motor/Sensory Function   Overall Oral Motor/Sensory Function Within functional limits             Ice Chips - 05/30/22 1311       Ice Chips   Ice chips Not tested             Thin Liquid - 05/30/22 1311       Thin Liquid   Thin Liquid Within functional limits    Presentation Cup;Self Fed;Straw             Nectar thick liquid - 05/30/22 1312       Nectar Thick Liquid   Nectar Thick Liquid Within functional limits    Presentation Straw             Honey Thick Liquid - 05/30/22 1312       Honey Thick Liquid   Honey Thick Liquid Not tested             Puree - 05/30/22 1312       Puree   Puree Not tested             Solid - 05/30/22 1312       Solid   Solid Not tested                 SLP Education - 05/30/22 1308     Education Details Plan for sister to use a liquid intake log    Person(s) Educated Patient;Caregiver(s)    Methods Explanation    Comprehension Verbalized understanding              SLP Short Term Goals - 05/30/22 1314       SLP SHORT TERM GOAL #1   Title Pt will consume thin liquids in safe and efficient manner as judged by Pt self report and caregiver report with use of strategies as needed.    Baseline Decreaed intake of liquids, caregiver fears dehydration    Time 4    Period Weeks    Status New    Target Date 06/28/22      SLP SHORT TERM GOAL #2   Title Pt will increase liquid intake to greater than 64 oz per day by documenting on liquid intake log with assist from caregiver.    Baseline varies    Time 4    Period Weeks    Status New    Target Date 06/28/22              SLP Long Term Goals - 05/30/22  1318       SLP LONG TERM GOAL #1   Title Same as short term goals              Plan - 05/30/22 1313     Clinical Impression Statement MBSS was completed 04/16/2022 with recommendations  for regular textures and thin liquids (see above), however Pt's sister indicates that she still has concerns regarding Pt's ability to safely and efficiently swallow liquids. She indicates that she seems to hold the liquids and cannot drink them quickly. Imaging from MBSS reviewed which does show inability for Pt to orally hold the liquids in her oral cavity. Premature spillage to the pyriforms occurs, however no penetration or aspiration observed. SLP provided Pt with thin liquids via cup and straw with different sized straws and different liquids, also NTL. Pt consumed 120 cc via single sips, she was unable to swallow in sequential fashion. Pt may prefer water with flavoring or cutting juices with water to encourage intake. SLP suggested that they create a chart to document liquid intake to see if this encourages intake. Pt did well using a regular straw and no change with coffee stirrer straw. Plan to see Pt for 2 more sessions to address the above stated goals.    Speech Therapy Frequency 1x /week    Duration 4 weeks    Treatment/Interventions Aspiration precaution training;Compensatory techniques;Diet toleration management by SLP;Patient/family education;Compensatory strategies;SLP instruction and feedback    Potential to Achieve Goals Good    Potential Considerations Ability to learn/carryover information    Consulted and Agree with Plan of Care Patient;Family member/caregiver             Patient will benefit from skilled therapeutic intervention in order to improve the following deficits and impairments:   Dysphagia, oropharyngeal phase    Problem List Patient Active Problem List   Diagnosis Date Noted   Dysphagia 04/04/2022   Burning sensation of throat 04/04/2022   Constipation 04/04/2022   Menorrhagia with regular cycle 05/08/2017   Autism 01/11/2017   Thank you,  Havery Moros, CCC-SLP [phone]  Che Below, CCC-SLP 05/30/2022, 1:19 PM  [facility] [address] [place], [place], [address] Phone: [phone]   Fax:  [phone]  Name: Angela Mcdonald MRN: [id number] Date of Birth: 07/12/2000

## 2022-07-05 ENCOUNTER — Ambulatory Visit: Payer: Medicaid Other | Admitting: Gastroenterology

## 2022-07-05 NOTE — Progress Notes (Deleted)
GI Office Note    Referring Provider: Dede Query, PA-C Primary Care Physician:  Dede Query, PA-C Primary Gastroenterologist: Cristopher Estimable.Rourk, MD  Date:  07/05/2022  ID:  Angela Mcdonald, DOB [birth year], MRN [id number]   Chief Complaint   No chief complaint on file.   History of Present Illness  Angela Mcdonald is a 22 y.o. female with a history of autism, allergies, and dysphagia*** presenting today for follow up.   Seen for initial consultation of dysphagia 04/04/2022.  Patient presented with her sister this full custody and help provide medical history.  Patient did have a hard time swallowing liquids for 8 to 10 months, having more trouble with action of swallowing.  Tried straws but this did not help.  No trouble swallowing food.  Also reported pain in her neck/throat at times described as burning usually occurring in the morning.  No burning in chest.  This occurred over the last couple of months.  Denied any abdominal pain, nausea, vomiting.  Minimal to mild constipation using MiraLAX as needed.  Usually with a bowel movement every other day with occasional hard stools.  Denies melena or BRBPR.  BPE and modified barium swallow ordered.  Advised to start Pepcid 20 mg nightly.  GERD precautions discussed.  Also advised to start Benefiber 2 teaspoons daily and continue MiraLAX as needed.  Follow-up in 3 months or sooner if needed.  Further recommendations after swallow studies.  BPE 04/12/2022: Normal.  Barium tablet easily passed.  No signs of aspiration.  No mass or stricture.  Modified barium swallow 04/16/2022: -Reported decrease in discomfort of swallowing since starting antireflux medication -Sister continue to report long time to drink liquids. -Impaired oral phase with decreased bolus cohesion and piecemeal swallowing with thin liquids -Pooling in the pharynx after initiation swallow from oral residuals feeling down for which she is able to clear with a secondary  swallow. -Oropharyngeal phase within normal limits with pures and solids -Regular textures and thin liquids via cup/straw and medications whole with water. -SLP suggested smaller cups and encourage Once upon a time with resting On table between sips -Remain upright for at least 30 minutes after meals -Multiple swallows after each bite/sip.  Follow-up with Genene Churn, SLP 05/30/2022.  Patient unable to swallow in sequential fashion with sips.  Advised water with flavoring including juices with water to encourage intake.  Suggested they create a chart to document liquid intake to encourage consumption.  Overall he did well using a regular straw.  Plan was to see speech therapy for 2 more sessions to address goal of improved swallowing and increasing liquid intake.  Today:    Current Outpatient Medications  Medication Sig Dispense Refill   diphenhydrAMINE (BENADRYL) 25 MG tablet Take 25 mg by mouth every 6 (six) hours as needed for allergies.     fexofenadine (ALLEGRA ALLERGY) 60 MG tablet Take 1 tablet (60 mg total) by mouth 2 (two) times daily as needed for allergies or rhinitis. 180 tablet 3   guanFACINE (INTUNIV) 1 MG TB24 ER tablet Take 1 mg by mouth daily.     polyethylene glycol powder (GLYCOLAX/MIRALAX) powder Take 17 g by mouth 2 (two) times daily as needed. 3350 g 1   No current facility-administered medications for this visit.    Past Medical History:  Diagnosis Date   Allergy    cats   Autism     No past surgical history on file.  Family History  Problem Relation Age of Onset  Depression Mother    Hearing loss Mother    Diabetes Father    Hyperlipidemia Father    Hypertension Father    Stroke Father    Learning disabilities Brother    Cancer Maternal Aunt        breast, 9   Arthritis Maternal Grandfather     Allergies as of 07/05/2022 - Review Complete 05/30/2022  Allergen Reaction Noted   Cat hair extract Anaphylaxis 01/11/2017    Social History    Socioeconomic History   Marital status: Single    Spouse name: Not on file   Number of children: Not on file   Years of education: Not on file   Highest education level: Not on file  Occupational History   Not on file  Tobacco Use   Smoking status: Never   Smokeless tobacco: Never  Vaping Use   Vaping Use: Never used  Substance and Sexual Activity   Alcohol use: No   Drug use: No   Sexual activity: Never  Other Topics Concern   Not on file  Social History Narrative   Not on file   Social Determinants of Health   Financial Resource Strain: Not on file  Food Insecurity: Not on file  Transportation Needs: Not on file  Physical Activity: Not on file  Stress: Not on file  Social Connections: Not on file     Review of Systems   Gen: Denies fever, chills, anorexia. Denies fatigue, weakness, weight loss.  CV: Denies chest pain, palpitations, syncope, peripheral edema, and claudication. Resp: Denies dyspnea at rest, cough, wheezing, coughing up blood, and pleurisy. GI: See HPI Derm: Denies rash, itching, dry skin Psych: Denies depression, anxiety, memory loss, confusion. No homicidal or suicidal ideation.  Heme: Denies bruising, bleeding, and enlarged lymph nodes.   Physical Exam   There were no vitals taken for this visit.  General:   Alert and oriented. No distress noted. Pleasant and cooperative.  Head:  Normocephalic and atraumatic. Eyes:  Conjuctiva clear without scleral icterus. Mouth:  Oral mucosa pink and moist. Good dentition. No lesions. Lungs:  Clear to auscultation bilaterally. No wheezes, rales, or rhonchi. No distress.  Heart:  S1, S2 present without murmurs appreciated.  Abdomen:  +BS, soft, non-tender and non-distended. No rebound or guarding. No HSM or masses noted. Rectal: *** Msk:  Symmetrical without gross deformities. Normal posture. Extremities:  Without edema. Neurologic:  Alert and  oriented x4 Psych:  Alert and cooperative. Normal mood  and affect.   Assessment  Angela Mcdonald is a 22 y.o. female with a history of *** presenting today with   Oropharyngeal dysphagia: BPE completed in October as well as modified barium swallow study.  BPE normal.  MBSS with SLP noted oropharyngeal dysphagia occurring with sips of liquids.  Does well with a straw and secondary swallowing.  Normal swallowing with pures and solids.  Initial recommendation for regular textures with thin liquids using a straw ensuring a secondary swallow in between bites and sips.  Has been following with speech therapy. ***  Burning sensation in throat: Noted at initial consultation.  Started on famotidine 20 mg nightly ***  Constipation:   PLAN   *** Continue famotidine 20 mg nightly Continue to follow with speech-language pathology     Venetia Night, MSN, FNP-BC, AGACNP-BC [facility]

## 2022-07-29 IMAGING — CT CT HEAD W/O CM
3 series · 15 of 47 positions shown, 18 images · non-contrast
Comparison: No pertinent prior exams available for comparison.

CLINICAL DATA: Headache, new or worsening, positional (Age 19-49y)
New onset headache.

EXAM:
CT HEAD WITHOUT CONTRAST
TECHNIQUE: Contiguous axial images were obtained from the base of the skull
through the vertex without intravenous contrast.

[Series 2: head wo · axial · 0.44mm/px · z∈[+1045,+1180]mm · 9 of 33 slices shown, 12 images]
[im 3/33  brain]
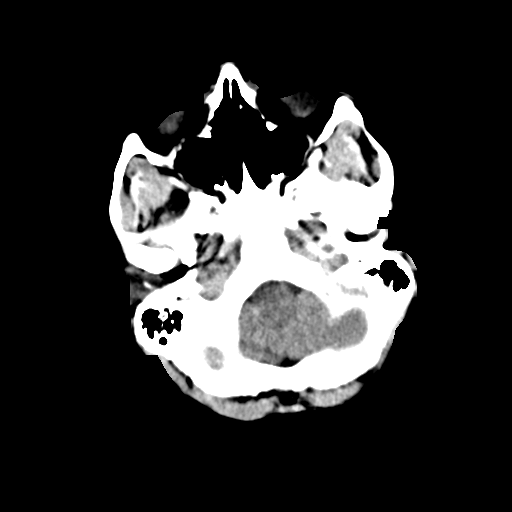
[im 3/33  bone]
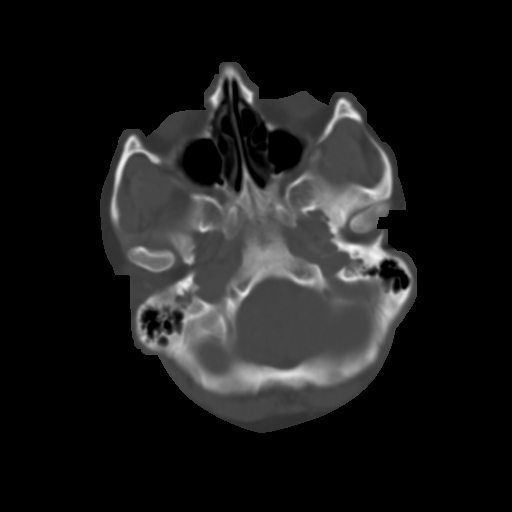
[im 6/33  brain]
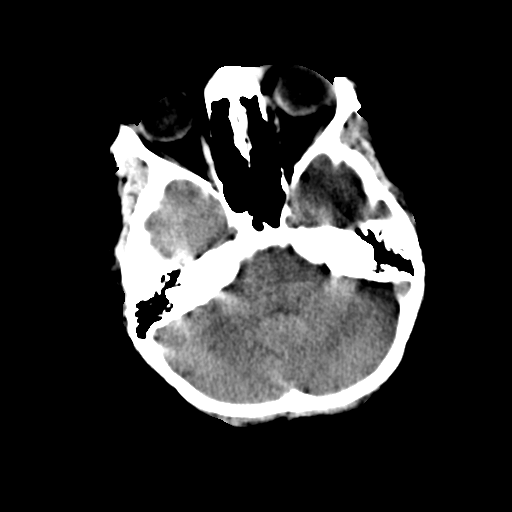
[im 9/33  brain]
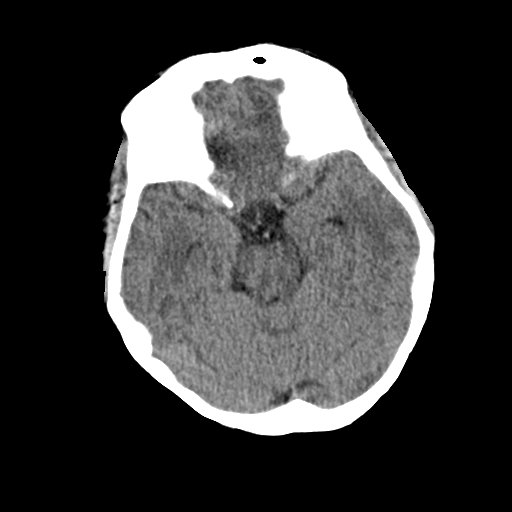
[im 13/33  brain]
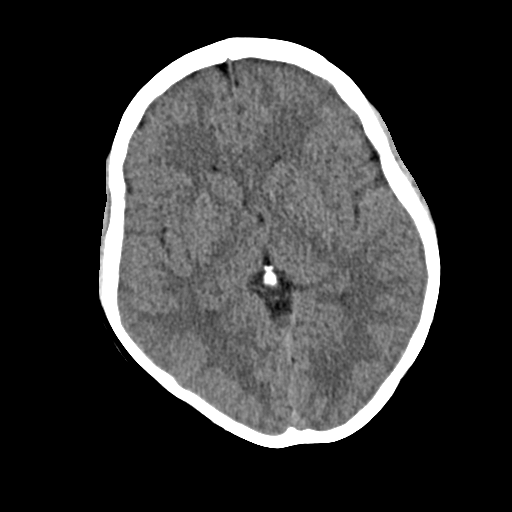
[im 17/33  brain]
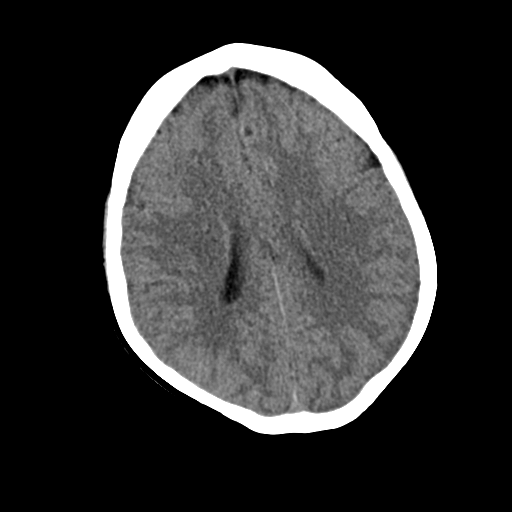
[im 17/33  bone]
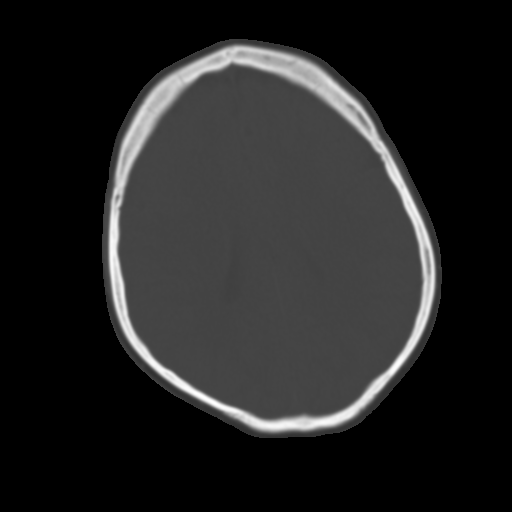
[im 20/33  brain]
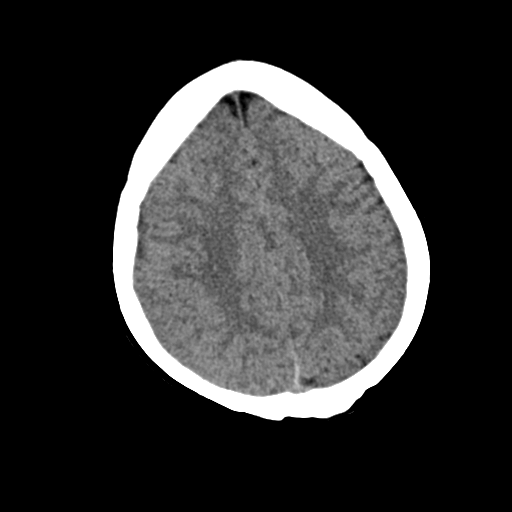
[im 24/33  brain]
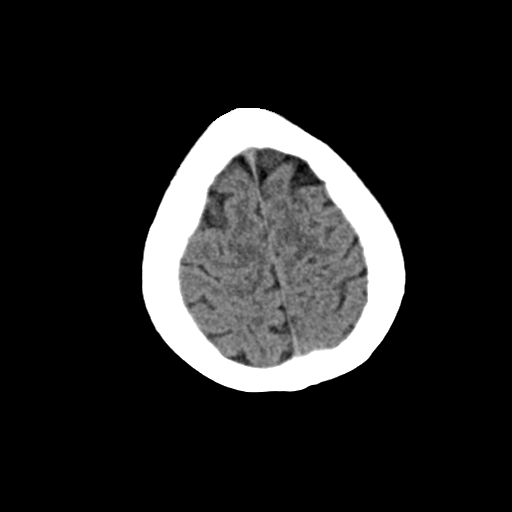
[im 27/33  brain]
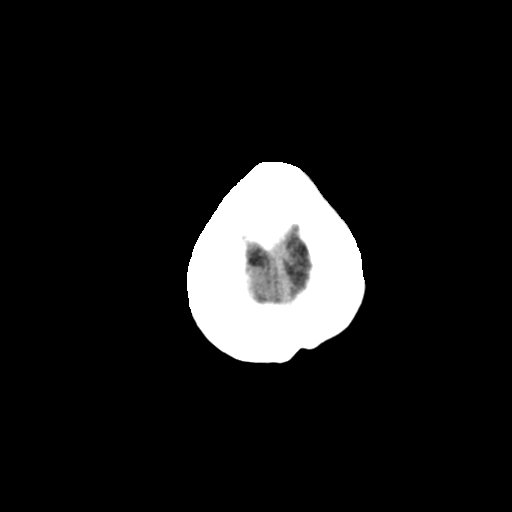
[im 30/33  brain]
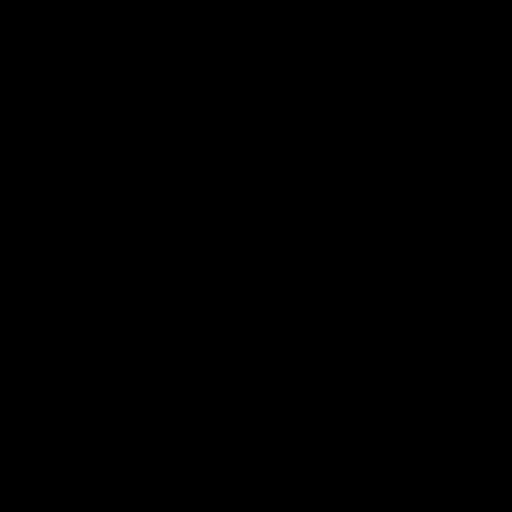
[im 30/33  bone]
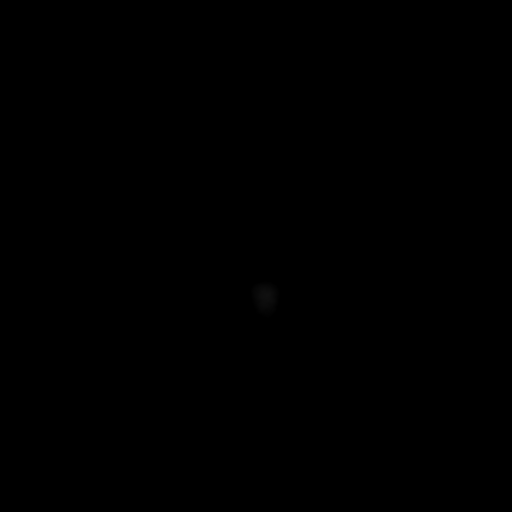

[Series 4: coronal soft · coronal · 0.32mm/px · 3 of 71 slices shown]
[im 24/71  brain]
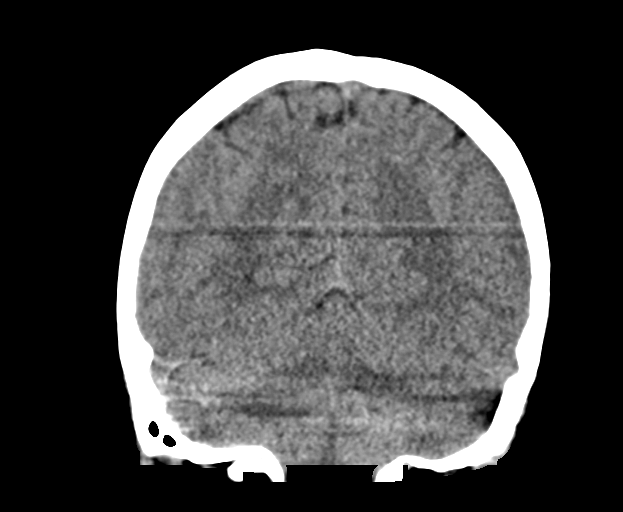
[im 32/71  brain]
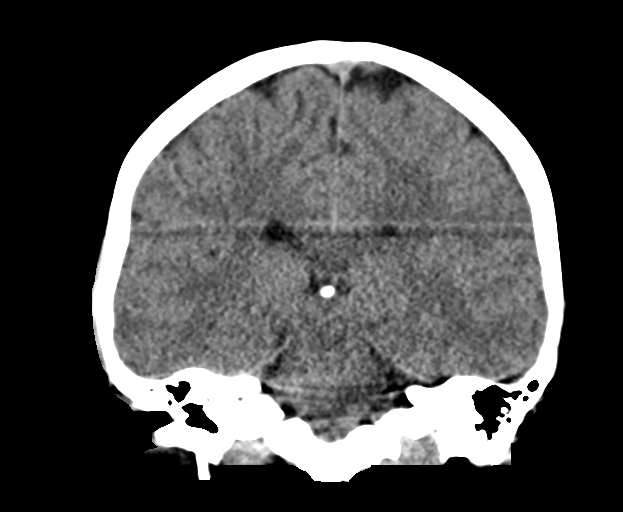
[im 39/71  brain]
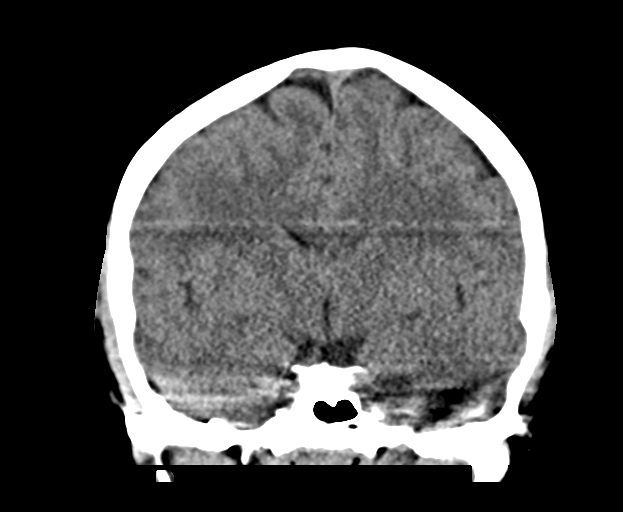

[Series 5: sag soft · sagittal · 0.32mm/px · 3 of 67 slices shown]
[im 23/67  brain]
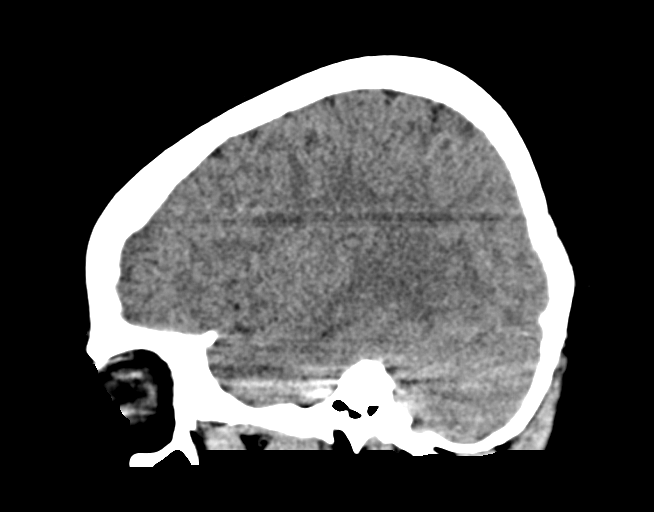
[im 34/67  brain]
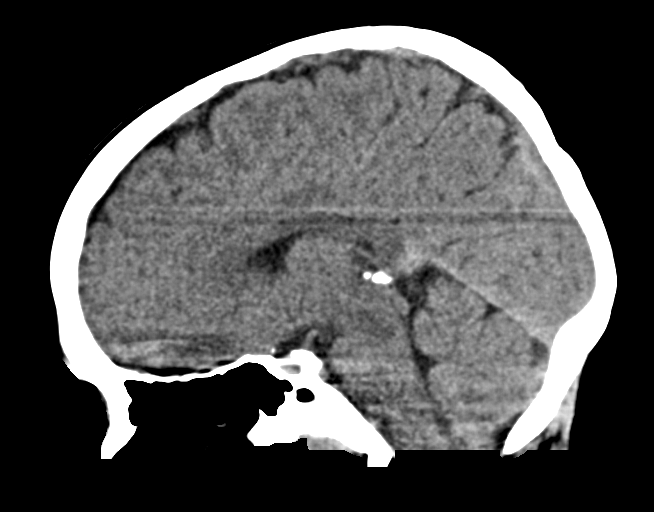
[im 45/67  brain]
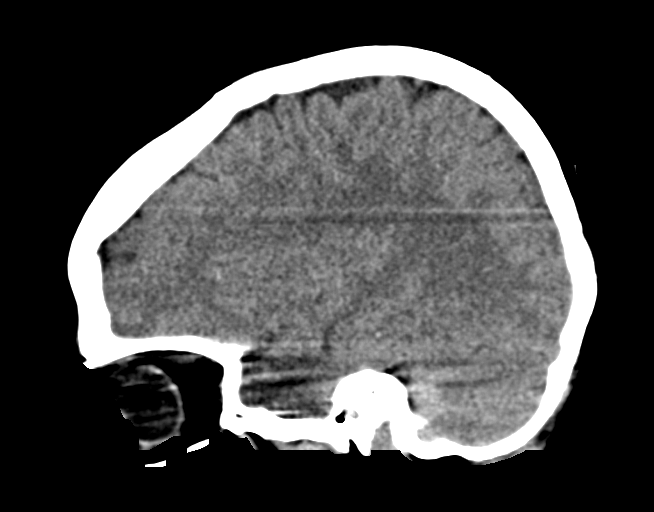

[15 of 47 positions shown; findings below may reference images not displayed]

FINDINGS: Brain:

Please note the inferiormost aspect of the cerebellar tonsils are
excluded from the field of view.

Mildly motion degraded exam.

Cerebral volume is normal.

There is no acute intracranial hemorrhage.

No demarcated cortical infarct.

No extra-axial fluid collection.

No evidence of an intracranial mass.

No midline shift.

Vascular: No hyperdense vessel.

Skull: Normal. Negative for fracture or focal lesion.

Sinuses/Orbits: Visualized orbits show no acute finding. No
significant paranasal sinus inflammatory disease at the imaged
levels.
IMPRESSION: Mildly motion degraded exam.

Please note the inferiormost aspect of the cerebellar tonsils are
excluded from the field of view.

Within this limitation, there is an unremarkable non-contrast CT
appearance of the brain. No evidence of acute intracranial
abnormality.

## 2024-04-30 ENCOUNTER — Other Ambulatory Visit: Payer: Self-pay

## 2024-04-30 ENCOUNTER — Ambulatory Visit
Admission: EM | Admit: 2024-04-30 | Discharge: 2024-04-30 | Disposition: A | Discharge status: Home / Self Care | Payer: MEDICAID

## 2024-04-30 ENCOUNTER — Ambulatory Visit (INDEPENDENT_AMBULATORY_CARE_PROVIDER_SITE_OTHER): Payer: MEDICAID

## 2024-04-30 ENCOUNTER — Encounter: Payer: Self-pay | Admitting: Emergency Medicine

## 2024-04-30 DIAGNOSIS — R079 Chest pain, unspecified: Secondary | ICD-10-CM | POA: Diagnosis not present

## 2024-04-30 DIAGNOSIS — R1110 Vomiting, unspecified: Secondary | ICD-10-CM

## 2024-04-30 MED ORDER — ONDANSETRON 4 MG PO TBDP: 4.0000 mg | ORAL_TABLET | Freq: Three times a day (TID) | ORAL | 0 refills | Status: AC | PRN

## 2024-04-30 NOTE — ED Provider Notes (Signed)
 [facility]    CSN: [id number] Arrival date & time: 04/30/24  1017      History   Chief Complaint No chief complaint on file.   HPI Angela Mcdonald is a 23 y.o. female.   The history is provided by the patient and a caregiver.   Patient brought in by her sister for complaints of chest pain with vomiting.  Sister reports chest pain started approximately 3 days ago, and patient developed an episode of vomiting this morning on 1 occasion.  Patient sister reports patient has a history of autism.  Patient and sister deny shortness of breath, difficulty breathing, radiation of pain, diarrhea, or constipation.  Sister reports patient was given Prilosec on yesterday and her chest pain improved.  The sister reports shortly after the patient had her normal green drink she vomited.  The patient denies abdominal pain or tenderness.  Sister denies diarrhea, or constipation.  The patient is currently taking omeprazole as she has a history of dysphagia.  Sister reports patient has seen GI for the dysphagia which has since improved after seeing GI and starting the omeprazole.  The patient's sister denies history of heart disease or lung disease.  Patient describes the pain in her chest as heartburn during triage.  With this provider, she states it just hurts.  Past Medical History:  Diagnosis Date   Allergy    cats   Autism     Patient Active Problem List   Diagnosis Date Noted   Dysphagia 04/04/2022   Burning sensation of throat 04/04/2022   Constipation 04/04/2022   Menorrhagia with regular cycle 05/08/2017   Autism 01/11/2017    History reviewed. No pertinent surgical history.  OB History   No obstetric history on file.      Home Medications    Prior to Admission medications   Medication Sig Start Date End Date Taking? Authorizing Provider  omeprazole (PRILOSEC) 20 MG capsule Take 20 mg by mouth daily.   Yes [provider]  diphenhydrAMINE  (BENADRYL )  25 MG tablet Take 25 mg by mouth every 6 (six) hours as needed for allergies.    [provider]  fexofenadine  (ALLEGRA  ALLERGY) 60 MG tablet Take 1 tablet (60 mg total) by mouth 2 (two) times daily as needed for allergies or rhinitis. 08/19/20   Dettinger, Fonda LABOR, MD  guanFACINE (INTUNIV) 1 MG TB24 ER tablet Take 1 mg by mouth daily. 03/02/22   [provider]  polyethylene glycol powder (GLYCOLAX /MIRALAX ) powder Take 17 g by mouth 2 (two) times daily as needed. 09/11/17   Dettinger, Fonda LABOR, MD    Family History Family History  Problem Relation Age of Onset   Depression Mother    Hearing loss Mother    Diabetes Father    Hyperlipidemia Father    Hypertension Father    Stroke Father    Learning disabilities Brother    Cancer Maternal Aunt        breast, 50   Arthritis Maternal Grandfather     Social History Social History   Tobacco Use   Smoking status: Never   Smokeless tobacco: Never  Vaping Use   Vaping status: Never Used  Substance Use Topics   Alcohol use: No   Drug use: No     Allergies   Cat dander   Review of Systems Review of Systems Per HPI  Physical Exam Triage Vital Signs ED Triage Vitals [04/30/24 1030]  Encounter Vitals Group     BP 127/87  Girls Systolic BP Percentile      Girls Diastolic BP Percentile      Boys Systolic BP Percentile      Boys Diastolic BP Percentile      Pulse Rate (!) 104     Resp 18     Temp 98.8 F (37.1 C)     Temp Source Oral     SpO2 97 %     Weight      Height      Head Circumference      Peak Flow      Pain Score      Pain Loc      Pain Education      Exclude from Growth Chart    No data found.  Updated Vital Signs BP 127/87 (BP Location: Right Arm)   Pulse (!) 104   Temp 98.8 F (37.1 C) (Oral)   Resp 18   LMP 03/30/2024 (Approximate)   SpO2 97%   Visual Acuity Right Eye Distance:   Left Eye Distance:   Bilateral Distance:    Right Eye Near:   Left Eye Near:     Bilateral Near:     Physical Exam Vitals and nursing note reviewed.  Constitutional:      General: She is not in acute distress.    Appearance: Normal appearance.  HENT:     Head: Normocephalic.  Eyes:     Extraocular Movements: Extraocular movements intact.     Conjunctiva/sclera: Conjunctivae normal.     Pupils: Pupils are equal, round, and reactive to light.  Cardiovascular:     Rate and Rhythm: Normal rate and regular rhythm.     Pulses: Normal pulses.     Heart sounds: Normal heart sounds.  Pulmonary:     Effort: Pulmonary effort is normal. No respiratory distress.     Breath sounds: Normal breath sounds. No stridor. No wheezing, rhonchi or rales.  Chest:     Chest wall: Tenderness present.    Abdominal:     General: Bowel sounds are normal.     Palpations: Abdomen is soft.     Tenderness: There is no abdominal tenderness.  Musculoskeletal:     Cervical back: Normal range of motion.  Skin:    General: Skin is warm and dry.  Neurological:     General: No focal deficit present.     Mental Status: She is alert and oriented to person, place, and time.  Psychiatric:        Mood and Affect: Mood normal.        Behavior: Behavior normal.      UC Treatments / Results  Labs (all labs ordered are listed, but only abnormal results are displayed) Labs Reviewed - No data to display  EKG: Normal sinus rhythm, no STEMI.  No other comparisons available.   Radiology No results found.  Procedures Procedures (including critical care time)  Medications Ordered in UC Medications - No data to display  Initial Impression / Assessment and Plan / UC Course  I have reviewed the triage vital signs and the nursing notes.  Pertinent labs & imaging results that were available during my care of the patient were reviewed by me and considered in my medical decision making (see chart for details).  On exam, the patient is well-appearing, she is in no acute distress, vital signs  are stable.  The patient does not display any vomiting at this time.  Do not suspect acute abdomen.  She does have  reproducible chest pain in the midsternal region noted on her exam.  The chest x-ray was negative for active cardiopulmonary disease, EKG shows normal sinus rhythm.  Do not suspect patient's symptoms are of cardiac etiology, symptoms consistent with possible symptoms of reflux or other GI cause.  Symptomatic treatment provided with ondansetron  4 mg ODT.  Supportive care recommendations were provided discussed with the patient's sister to include continue monitoring for worsening symptoms, a BRAT diet, and recommending follow-up with her PCP for further evaluation.  Patient sister was in agreement with this plan of care and verbalizes understanding.  All questions were answered.  Patient stable for discharge.  Final Clinical Impressions(s) / UC Diagnoses   Final diagnoses:  Chest pain, unspecified type   Discharge Instructions   None    ED Prescriptions   None    PDMP not reviewed this encounter.   Gilmer Etta PARAS, NP 04/30/24 1715

## 2024-04-30 NOTE — Discharge Instructions (Signed)
 The EKG and chest x-ray were normal. Continue her current medications. Continue to monitor her symptoms for worsening.  If she develops worsening chest pain, shortness of breath, difficulty breathing, nausea, or vomiting, recommend follow-up in the emergency department for further evaluation. Follow-up with her primary care physician for reevaluation within the next 7 to 10 days. Follow-up as needed.

## 2024-04-30 NOTE — ED Triage Notes (Addendum)
 States has been having chest pain and stomach pain over the past several days.  Vomiting started this morning.  Has been complaining of heart burn.  Sister gave Prilosec and tums yesterday and pain was gone  States pain returned this morning.
